# Patient Record
Sex: Male | Born: 1935 | Race: White | Hispanic: No | Marital: Married
Health system: Southern US, Community
[De-identification: ages and names within clinical notes are randomized; demographics above are authoritative.]

---

## 2003-08-11 ENCOUNTER — Other Ambulatory Visit: Payer: Self-pay

## 2005-03-15 ENCOUNTER — Ambulatory Visit: Payer: Self-pay | Admitting: Internal Medicine

## 2005-03-18 ENCOUNTER — Ambulatory Visit: Payer: Self-pay | Admitting: Internal Medicine

## 2005-04-04 ENCOUNTER — Ambulatory Visit: Payer: Self-pay | Admitting: Urology

## 2005-04-04 ENCOUNTER — Other Ambulatory Visit: Payer: Self-pay

## 2005-04-07 ENCOUNTER — Ambulatory Visit: Payer: Self-pay | Admitting: Urology

## 2005-07-12 ENCOUNTER — Emergency Department: Payer: Self-pay | Admitting: Unknown Physician Specialty

## 2005-07-12 ENCOUNTER — Other Ambulatory Visit: Payer: Self-pay

## 2005-09-23 ENCOUNTER — Emergency Department: Payer: Self-pay | Admitting: Internal Medicine

## 2005-09-23 ENCOUNTER — Other Ambulatory Visit: Payer: Self-pay

## 2006-10-02 ENCOUNTER — Other Ambulatory Visit: Payer: Self-pay

## 2006-10-02 ENCOUNTER — Emergency Department: Payer: Self-pay | Admitting: Emergency Medicine

## 2006-10-18 ENCOUNTER — Ambulatory Visit: Payer: Self-pay | Admitting: Urology

## 2006-10-24 ENCOUNTER — Other Ambulatory Visit: Payer: Self-pay

## 2006-10-24 ENCOUNTER — Ambulatory Visit: Payer: Self-pay | Admitting: Urology

## 2006-10-26 ENCOUNTER — Ambulatory Visit: Payer: Self-pay | Admitting: Urology

## 2006-12-16 ENCOUNTER — Other Ambulatory Visit: Payer: Self-pay

## 2006-12-16 ENCOUNTER — Emergency Department: Payer: Self-pay

## 2008-02-21 ENCOUNTER — Emergency Department: Payer: Self-pay | Admitting: Emergency Medicine

## 2010-06-21 ENCOUNTER — Emergency Department: Payer: Self-pay | Admitting: Emergency Medicine

## 2011-01-31 ENCOUNTER — Emergency Department: Payer: Self-pay | Admitting: Emergency Medicine

## 2011-06-11 ENCOUNTER — Inpatient Hospital Stay: Payer: Self-pay | Admitting: Internal Medicine

## 2011-11-24 ENCOUNTER — Emergency Department: Payer: Self-pay | Admitting: Emergency Medicine

## 2011-12-27 ENCOUNTER — Inpatient Hospital Stay: Payer: Self-pay | Admitting: Internal Medicine

## 2011-12-27 LAB — CBC
HGB: 12.2 g/dL — ABNORMAL LOW (ref 13.0–18.0)
MCHC: 31.7 g/dL — ABNORMAL LOW (ref 32.0–36.0)
Platelet: 220 10*3/uL (ref 150–440)
RBC: 4.26 10*6/uL — ABNORMAL LOW (ref 4.40–5.90)
WBC: 14.7 10*3/uL — ABNORMAL HIGH (ref 3.8–10.6)

## 2011-12-27 LAB — TROPONIN I: Troponin-I: 0.04 ng/mL

## 2011-12-27 LAB — URINALYSIS, COMPLETE
Bilirubin,UR: NEGATIVE
Ketone: NEGATIVE
Ph: 5 (ref 4.5–8.0)
Specific Gravity: 1.014 (ref 1.003–1.030)
Squamous Epithelial: NONE SEEN
WBC UR: 1137 /HPF (ref 0–5)

## 2011-12-27 LAB — COMPREHENSIVE METABOLIC PANEL
Albumin: 2.5 g/dL — ABNORMAL LOW (ref 3.4–5.0)
Alkaline Phosphatase: 76 U/L (ref 50–136)
Anion Gap: 10 (ref 7–16)
BUN: 52 mg/dL — ABNORMAL HIGH (ref 7–18)
Bilirubin,Total: 0.5 mg/dL (ref 0.2–1.0)
Calcium, Total: 9.6 mg/dL (ref 8.5–10.1)
Co2: 22 mmol/L (ref 21–32)
Creatinine: 2.16 mg/dL — ABNORMAL HIGH (ref 0.60–1.30)
EGFR (Non-African Amer.): 27 — ABNORMAL LOW
Glucose: 141 mg/dL — ABNORMAL HIGH (ref 65–99)
Osmolality: 301 (ref 275–301)
Sodium: 143 mmol/L (ref 136–145)
Total Protein: 8.3 g/dL — ABNORMAL HIGH (ref 6.4–8.2)

## 2011-12-27 LAB — CK TOTAL AND CKMB (NOT AT ARMC): CK, Total: 197 U/L (ref 35–232)

## 2011-12-28 LAB — CBC WITH DIFFERENTIAL/PLATELET
Basophil #: 0 10*3/uL (ref 0.0–0.1)
Basophil %: 0.1 %
Eosinophil #: 0 10*3/uL (ref 0.0–0.7)
HCT: 36.1 % — ABNORMAL LOW (ref 40.0–52.0)
Lymphocyte #: 0.3 10*3/uL — ABNORMAL LOW (ref 1.0–3.6)
MCH: 29.3 pg (ref 26.0–34.0)
MCHC: 32.5 g/dL (ref 32.0–36.0)
MCV: 90 fL (ref 80–100)
Monocyte #: 1 x10 3/mm (ref 0.2–1.0)
Neutrophil #: 11.8 10*3/uL — ABNORMAL HIGH (ref 1.4–6.5)
Platelet: 214 10*3/uL (ref 150–440)
RBC: 4.01 10*6/uL — ABNORMAL LOW (ref 4.40–5.90)
RDW: 14.7 % — ABNORMAL HIGH (ref 11.5–14.5)
WBC: 13.1 10*3/uL — ABNORMAL HIGH (ref 3.8–10.6)

## 2011-12-28 LAB — BASIC METABOLIC PANEL
EGFR (African American): 41 — ABNORMAL LOW
EGFR (Non-African Amer.): 35 — ABNORMAL LOW
Glucose: 128 mg/dL — ABNORMAL HIGH (ref 65–99)
Osmolality: 311 (ref 275–301)
Sodium: 149 mmol/L — ABNORMAL HIGH (ref 136–145)

## 2011-12-28 LAB — HEPATIC FUNCTION PANEL A (ARMC)
Albumin: 2.1 g/dL — ABNORMAL LOW (ref 3.4–5.0)
Alkaline Phosphatase: 79 U/L (ref 50–136)
Bilirubin, Direct: 0.1 mg/dL (ref 0.00–0.20)
SGOT(AST): 52 U/L — ABNORMAL HIGH (ref 15–37)

## 2011-12-28 LAB — HEMOGLOBIN A1C: Hemoglobin A1C: 5.4 % (ref 4.2–6.3)

## 2011-12-29 LAB — BASIC METABOLIC PANEL
Anion Gap: 8 (ref 7–16)
BUN: 44 mg/dL — ABNORMAL HIGH (ref 7–18)
Calcium, Total: 9.1 mg/dL (ref 8.5–10.1)
Creatinine: 1.2 mg/dL (ref 0.60–1.30)
EGFR (African American): 46 — ABNORMAL LOW
EGFR (Non-African Amer.): 39 — ABNORMAL LOW
EGFR (Non-African Amer.): 54 — ABNORMAL LOW
Glucose: 130 mg/dL — ABNORMAL HIGH (ref 65–99)
Glucose: 158 mg/dL — ABNORMAL HIGH (ref 65–99)
Osmolality: 305 (ref 275–301)
Potassium: 3.5 mmol/L (ref 3.5–5.1)
Sodium: 142 mmol/L (ref 136–145)

## 2011-12-29 LAB — URINE CULTURE

## 2011-12-30 LAB — CULTURE, BLOOD (SINGLE)

## 2012-01-01 LAB — CBC WITH DIFFERENTIAL/PLATELET
Basophil #: 0 10*3/uL (ref 0.0–0.1)
Eosinophil %: 2 %
HCT: 37.9 % — ABNORMAL LOW (ref 40.0–52.0)
Lymphocyte %: 11 %
MCHC: 31.5 g/dL — ABNORMAL LOW (ref 32.0–36.0)
MCV: 90 fL (ref 80–100)
Monocyte #: 1 x10 3/mm (ref 0.2–1.0)
Monocyte %: 7.8 %
Neutrophil %: 78.8 %
Platelet: 343 10*3/uL (ref 150–440)
RBC: 4.22 10*6/uL — ABNORMAL LOW (ref 4.40–5.90)
RDW: 14.4 % (ref 11.5–14.5)
WBC: 12.1 10*3/uL — ABNORMAL HIGH (ref 3.8–10.6)

## 2012-01-01 LAB — BASIC METABOLIC PANEL
Anion Gap: 11 (ref 7–16)
BUN: 23 mg/dL — ABNORMAL HIGH (ref 7–18)
Calcium, Total: 8.9 mg/dL (ref 8.5–10.1)
EGFR (Non-African Amer.): 57 — ABNORMAL LOW
Glucose: 125 mg/dL — ABNORMAL HIGH (ref 65–99)
Osmolality: 285 (ref 275–301)

## 2012-01-01 LAB — MAGNESIUM: Magnesium: 1.9 mg/dL

## 2012-01-02 LAB — BASIC METABOLIC PANEL
Calcium, Total: 8.6 mg/dL (ref 8.5–10.1)
Co2: 23 mmol/L (ref 21–32)
Creatinine: 1.15 mg/dL (ref 0.60–1.30)
EGFR (African American): >60
EGFR (Non-African Amer.): 57 — ABNORMAL LOW
Glucose: 128 mg/dL — ABNORMAL HIGH (ref 65–99)
Osmolality: 284 (ref 275–301)

## 2012-01-02 LAB — CULTURE, BLOOD (SINGLE)

## 2012-04-28 ENCOUNTER — Emergency Department: Payer: Self-pay | Admitting: Emergency Medicine

## 2012-04-28 LAB — BASIC METABOLIC PANEL WITH GFR
Anion Gap: 9
BUN: 24 mg/dL — ABNORMAL HIGH
Calcium, Total: 9.2 mg/dL
Chloride: 114 mmol/L — ABNORMAL HIGH
Co2: 24 mmol/L
Creatinine: 1.38 mg/dL — ABNORMAL HIGH
EGFR (African American): 53 — ABNORMAL LOW
EGFR (Non-African Amer.): 46 — ABNORMAL LOW
Glucose: 126 mg/dL — ABNORMAL HIGH
Osmolality: 298
Potassium: 3.8 mmol/L
Sodium: 147 mmol/L — ABNORMAL HIGH

## 2012-04-28 LAB — CBC
HGB: 13 g/dL (ref 13.0–18.0)
MCV: 89 fL (ref 80–100)
Platelet: 250 10*3/uL (ref 150–440)
RBC: 4.38 10*6/uL — ABNORMAL LOW (ref 4.40–5.90)
WBC: 11.4 10*3/uL — ABNORMAL HIGH (ref 3.8–10.6)

## 2012-04-28 LAB — URINALYSIS, COMPLETE
Bilirubin,UR: NEGATIVE
Glucose,UR: NEGATIVE mg/dL (ref 0–75)
Ketone: NEGATIVE
Nitrite: NEGATIVE
Ph: 9 (ref 4.5–8.0)
Specific Gravity: 1.016 (ref 1.003–1.030)
Squamous Epithelial: NONE SEEN

## 2012-10-25 DEATH — deceased

## 2014-05-11 IMAGING — CR RIGHT ANKLE - 2 VIEW
1 series · 2 of 2 positions shown · non-contrast
Comparison: none

REASON FOR EXAM: fall, ankle swelling, pain
COMMENTS:

[Series 1: x ankle ap right · 0.14mm/px · 2 of 2 slices shown]
[im 1/2]
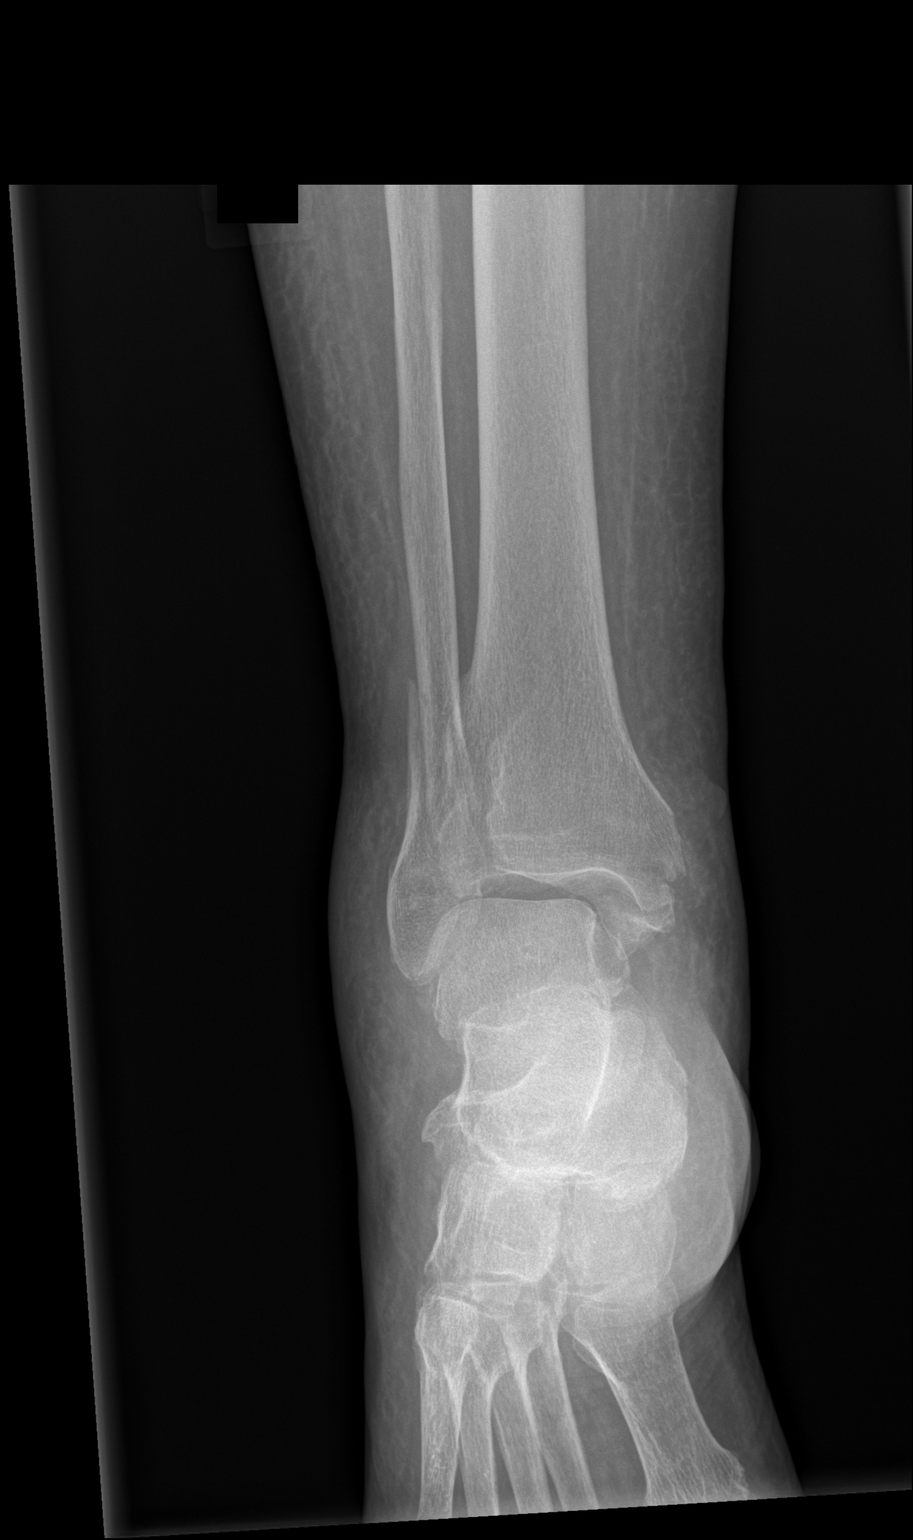
[im 2/2]
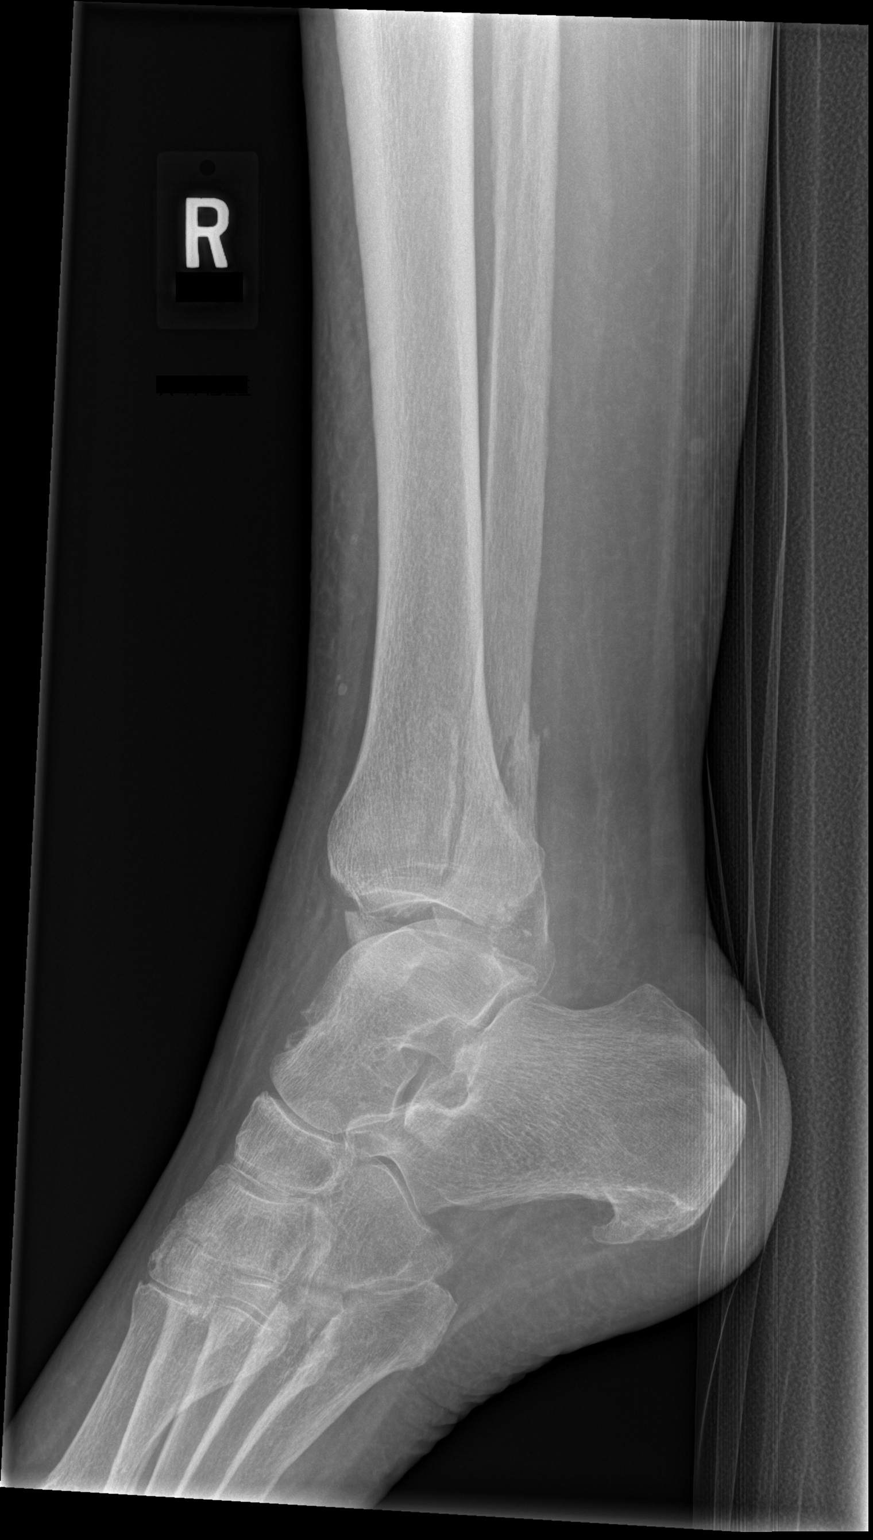

[2 of 2 positions shown; findings below may reference images not displayed]

PROCEDURE:     DXR - DXR ANKLE RIGHT AP AND LATERAL  - November 24, 2011  [DATE]

RESULT:     There is a bimalleolar fracture at the right ankle. The fracture
is observed through the medial malleolus. The tibia shows medial subluxation
by 3 to 4 mm with respect to the talus and the medial malleolar fragment.
There is an oblique fracture of the lateral malleolus. Bony fracture
components are minimally displaced. No definite posterior malleolar fracture
is seen on this exam. In the lateral view, there is incidentally noted a
plantar calcaneal spur.
IMPRESSION: 1.  There is a bimalleolar fracture of the right ankle.
2.  The ankle mortise is opened medially.
3.  Incidental note is made of a plantar calcaneal spur.

[REDACTED]

## 2014-06-19 IMAGING — CR RIGHT HAND - COMPLETE 3+ VIEW
1 series · 3 of 3 positions shown · non-contrast
Comparison: none

REASON FOR EXAM: gouty attack, pain and erythema of MCP joints
COMMENTS:

[Series 1: lat · 0.17mm/px · 3 of 3 slices shown]
[im 1/3]
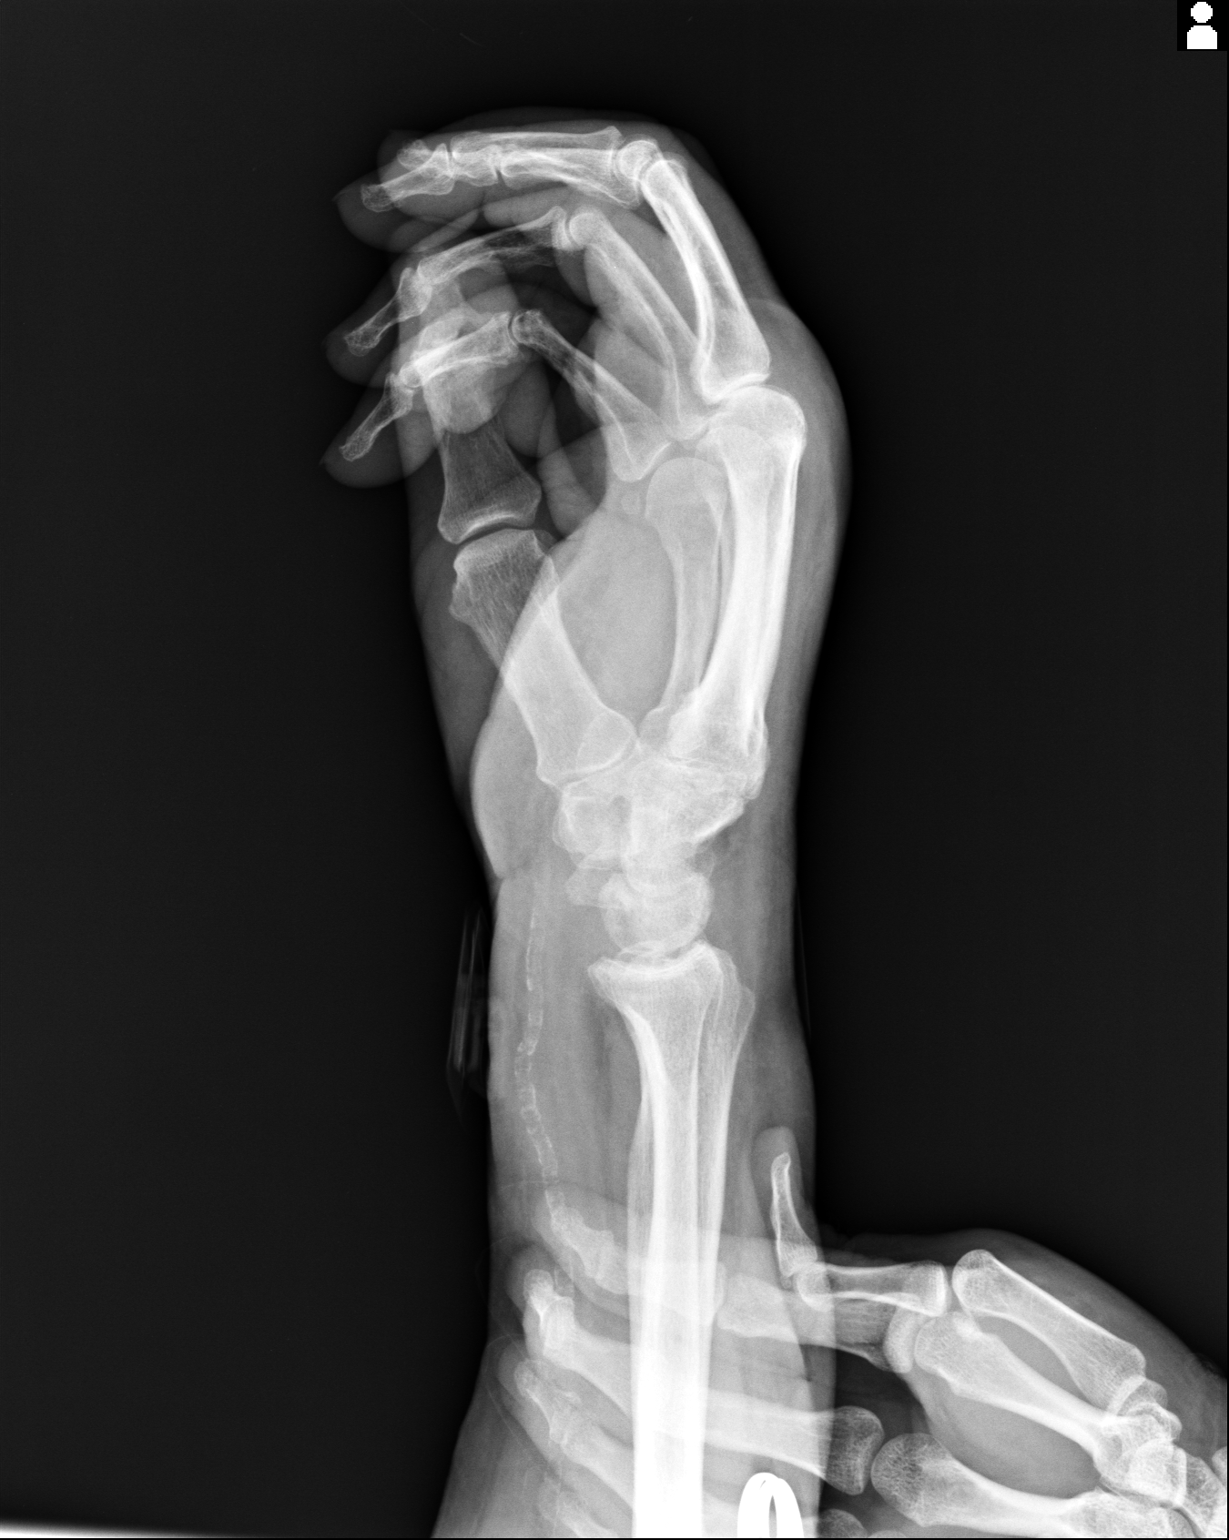
[im 2/3]
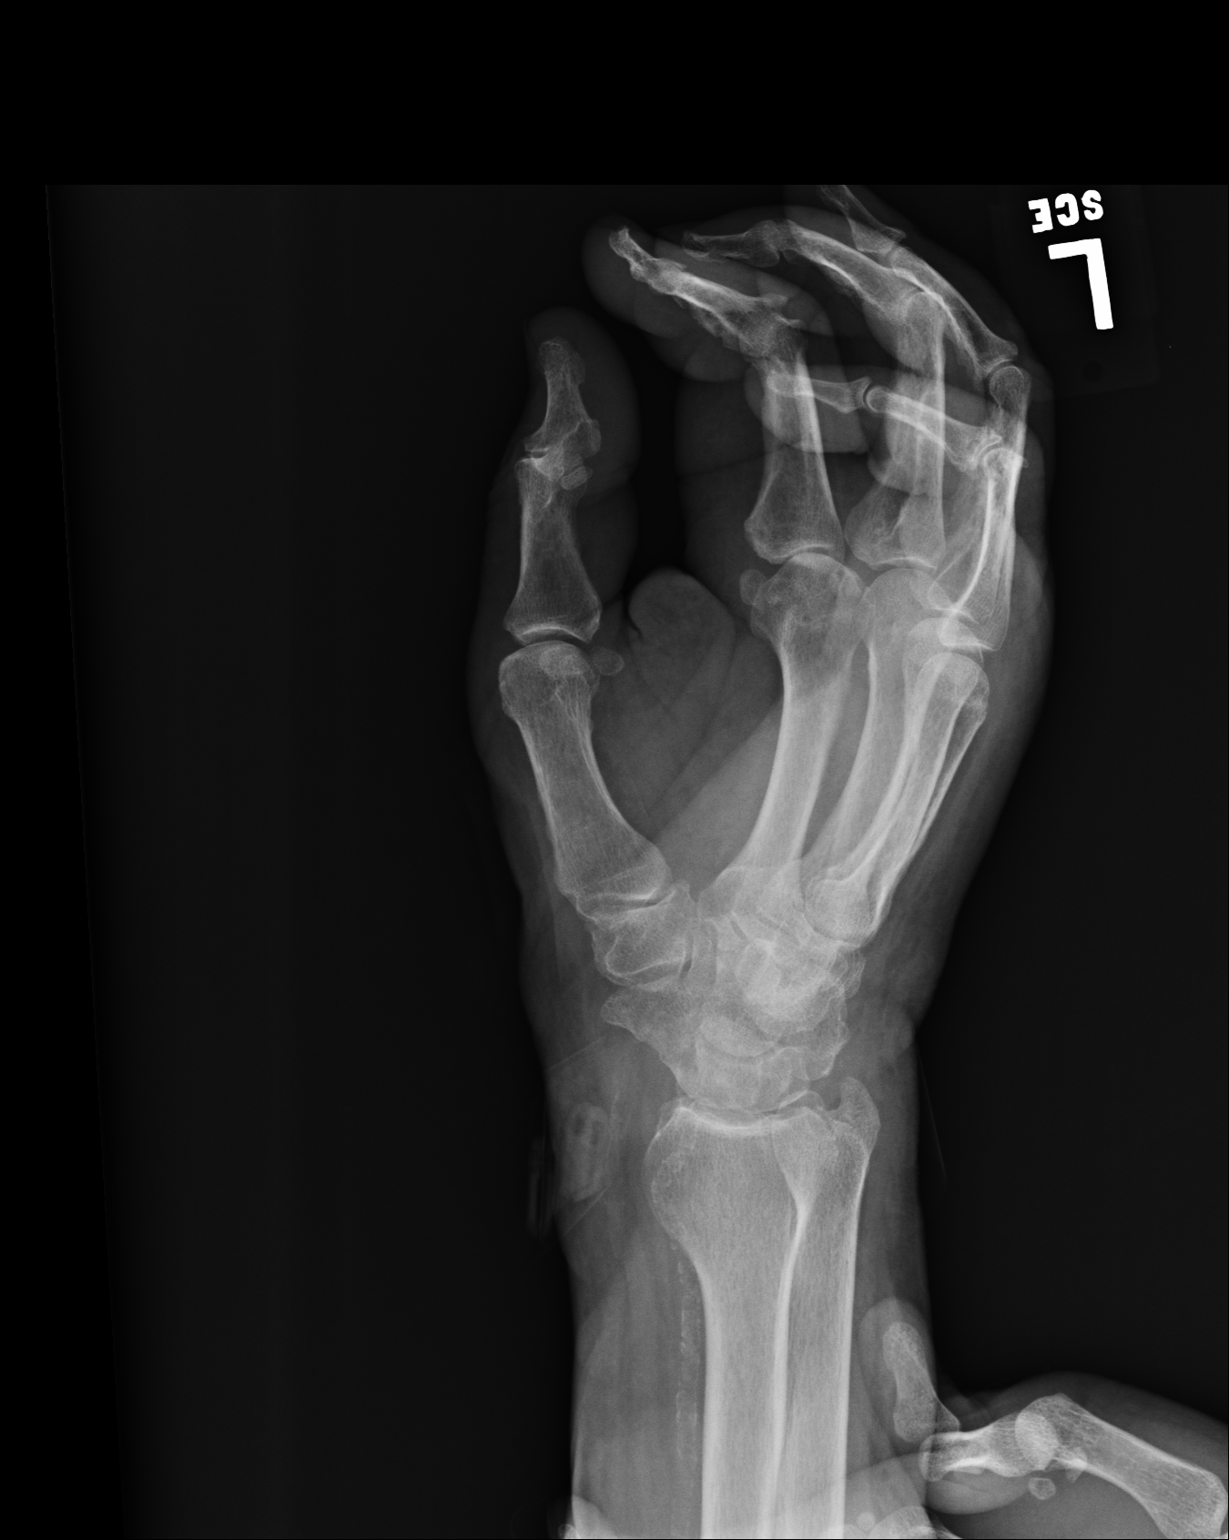
[im 3/3]
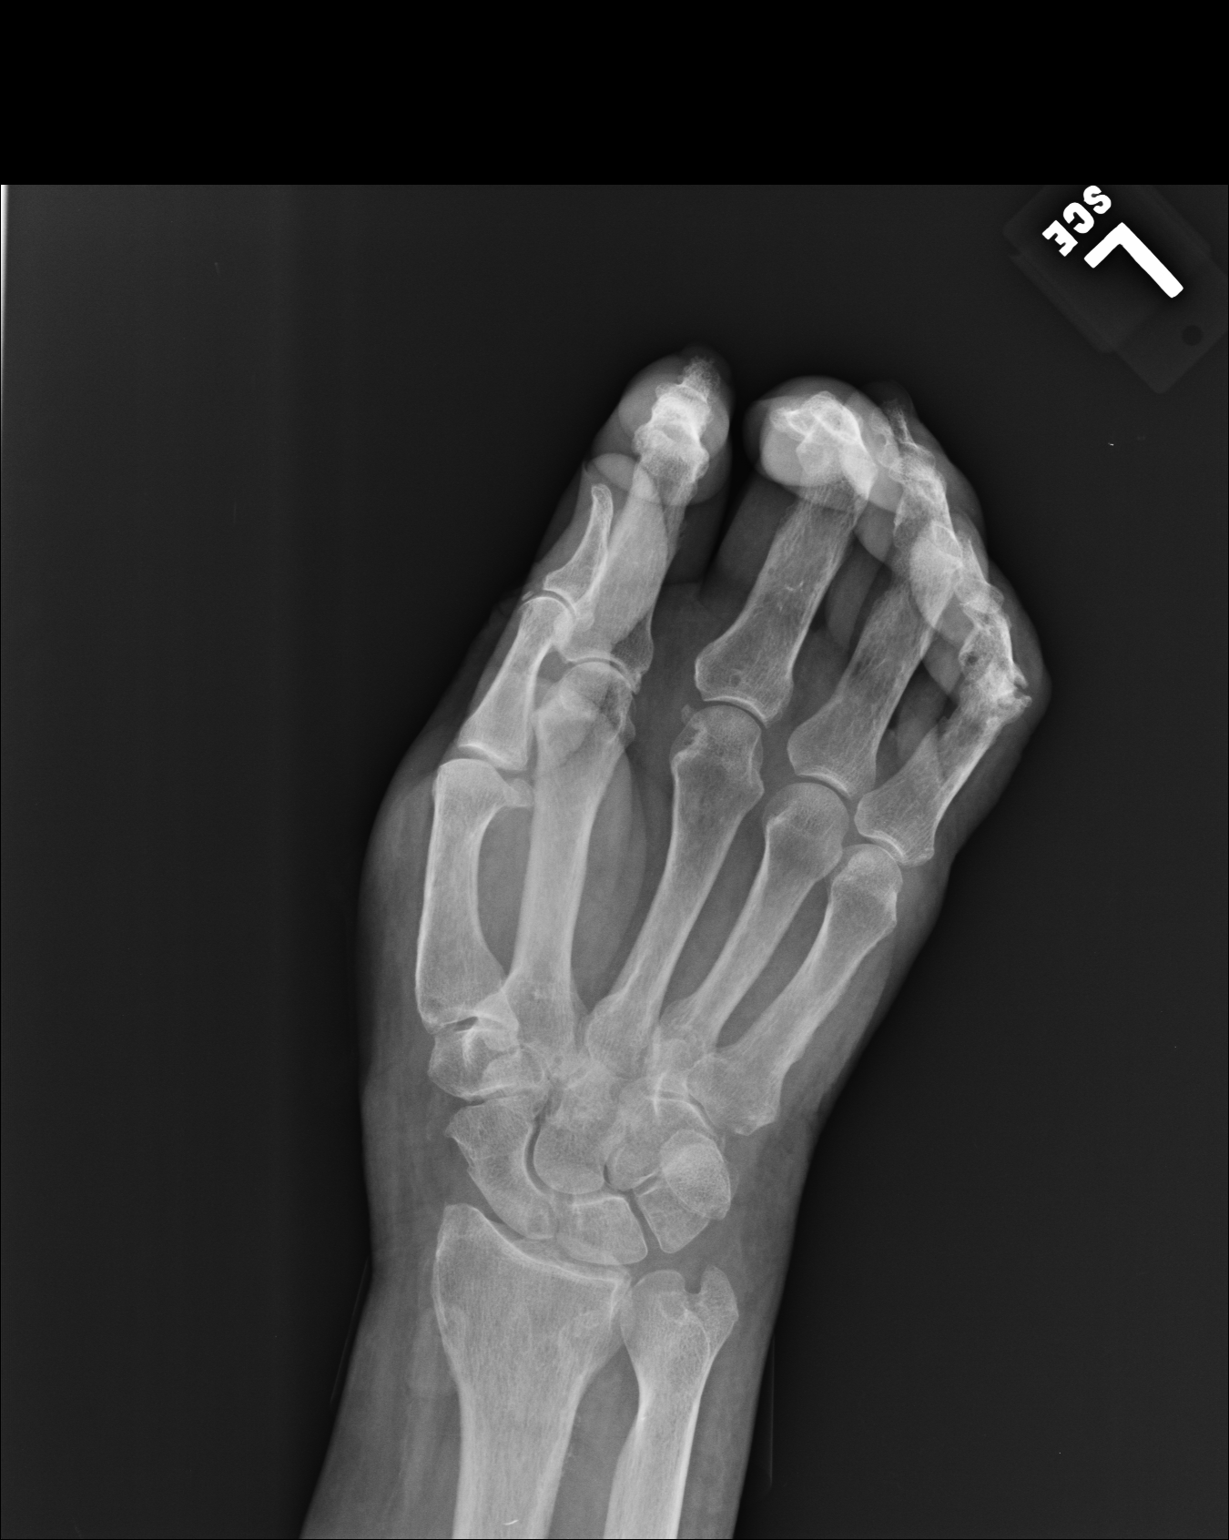

[3 of 3 positions shown; findings below may reference images not displayed]

PROCEDURE:     DXR - DXR HAND RT COMPLETE W/OBLIQUES  - January 02, 2012  [DATE]

RESULT:     Three views of the right hand are submitted. The patient was
unwilling or unable to extend the fingers.

There are degenerative changes of the interphalangeal joints especially of
the PIP joint of the fifth digit. There is also degenerative change of the
PIP joint of the second digit. No definite acute fracture is demonstrated.
There are vascular calcifications present.
IMPRESSION: The study is limited due to the patient's inability to
extend the fingers. There are findings that may reflect osteoarthritis or
gouty arthritis of the PIP joints of the second and fifth fingers. More
proximally no more than mild degenerative type change is evident. There is
mild soft tissue swelling.

## 2014-10-14 IMAGING — CR DG ABDOMEN 1V
1 series · 2 of 2 positions shown · non-contrast
Comparison: none

REASON FOR EXAM: hematuria, eval for kidney stone
COMMENTS:

[Series 1: t abdomen supine · 0.14mm/px · 2 of 2 slices shown]
[im 1/2]
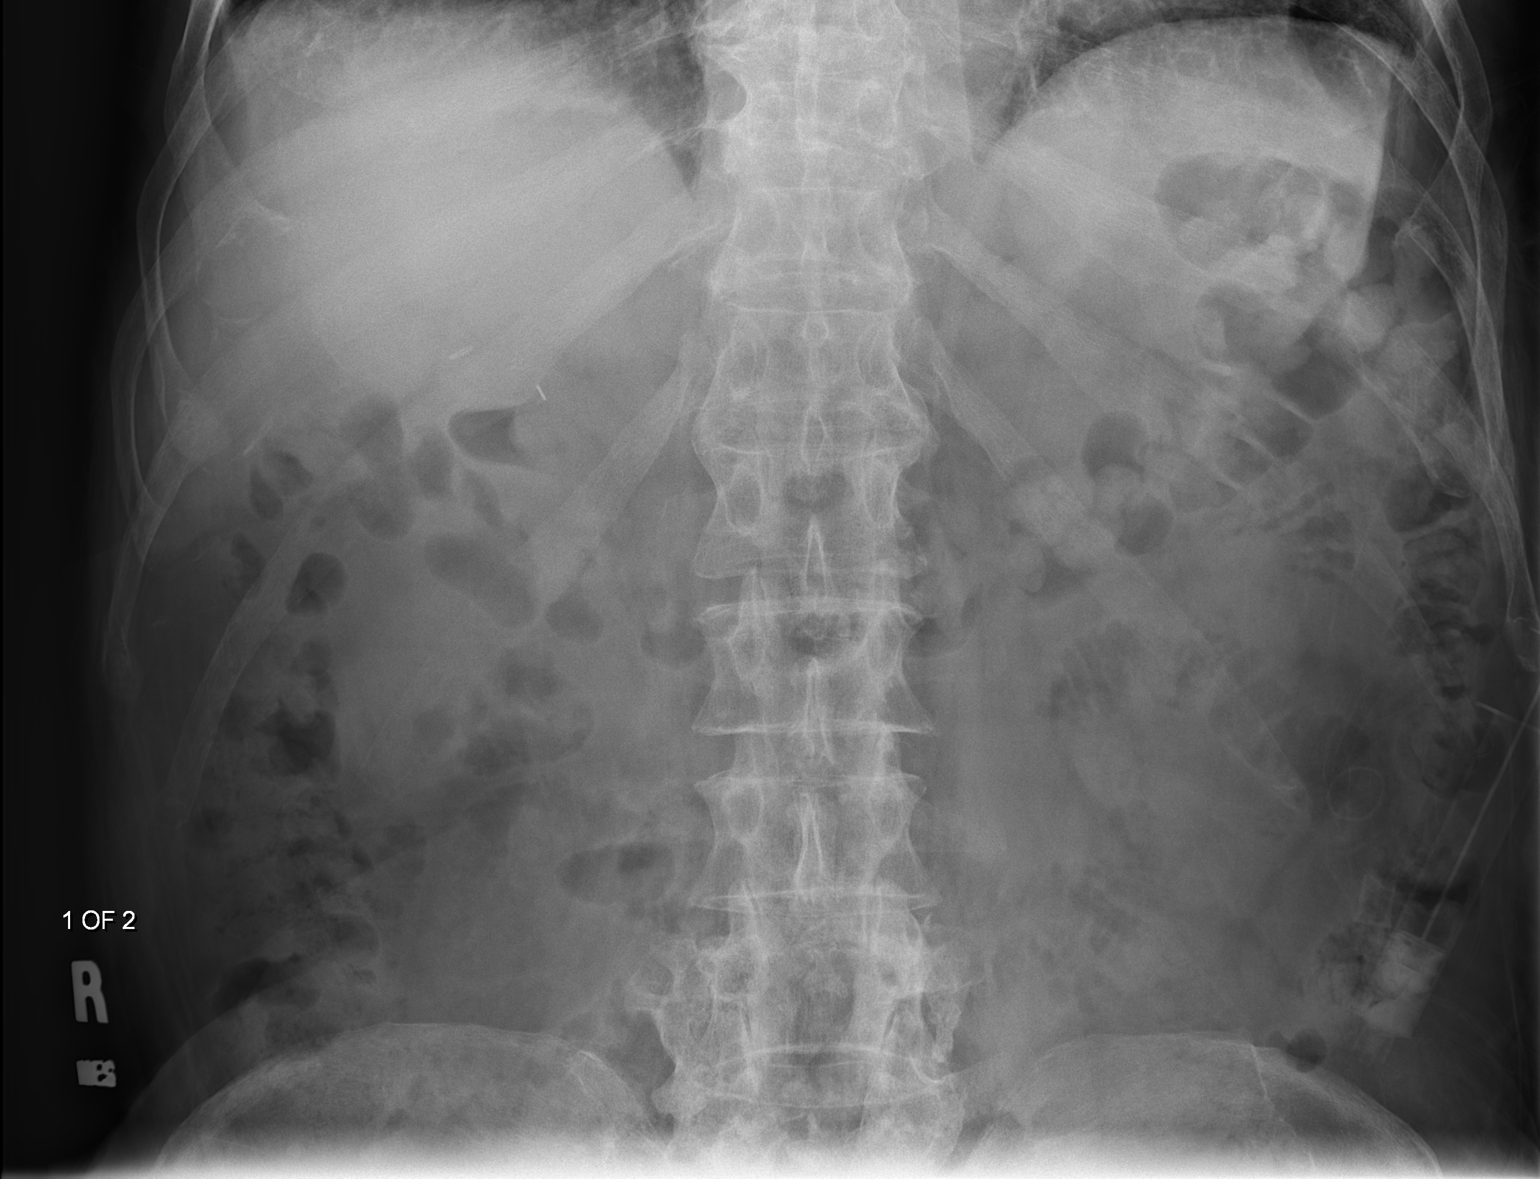
[im 2/2]
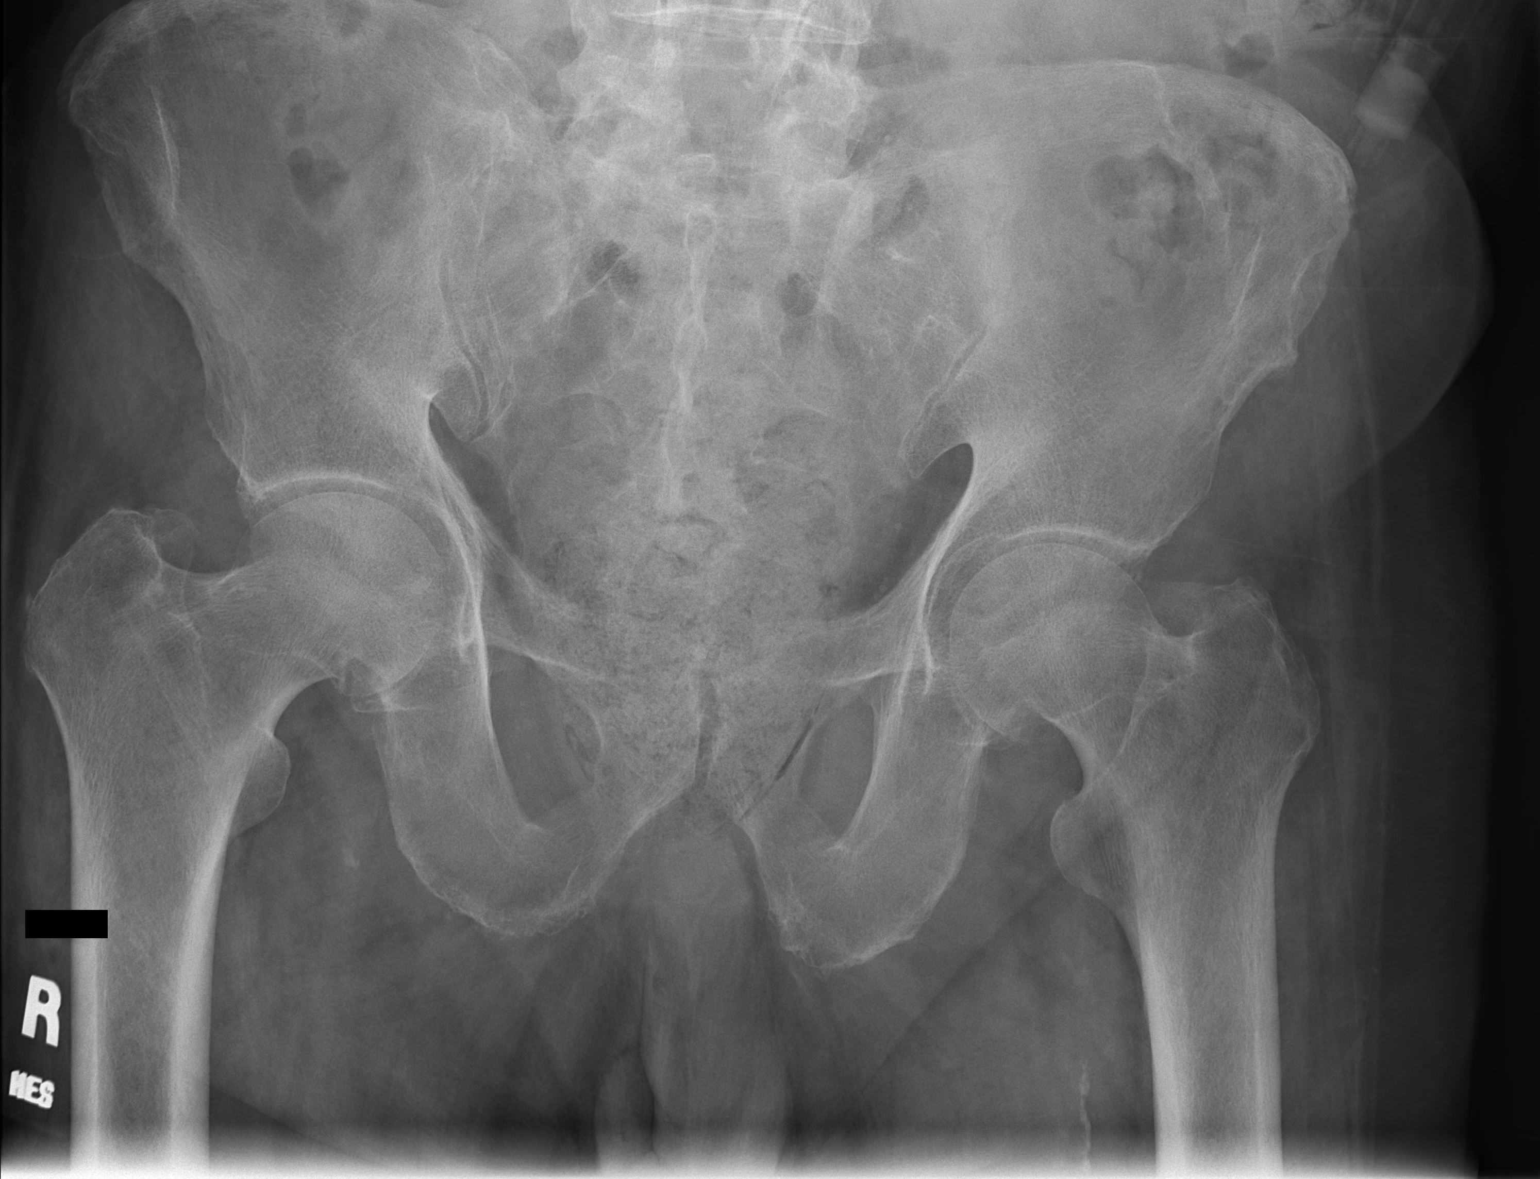

[2 of 2 positions shown; findings below may reference images not displayed]

PROCEDURE:     DXR - DXR KIDNEY URETER BLADDER  - April 28, 2012  [DATE]

RESULT:     Surgical clips right upper quadrant. Gas pattern nonspecific.
Calcification pelvis consistent with phleboliths. Degenerative change lumbar
spine. Peripheral vascular disease. Degenerative changes both hips. Stool in
colon.
IMPRESSION: No acute abnormality.

## 2014-10-19 NOTE — Discharge Summary (Signed)
PATIENT NAME:  Gregory Carlson, Gregory Carlson MR#:  [id number] DATE OF BIRTH:  1923/12/15  DATE OF ADMISSION:  12/27/2011 DATE OF DISCHARGE:  01/03/2012  ADMITTING PHYSICIAN: Shaune Pollack, MD   DISCHARGING PHYSICIAN: Enid Baas, MD    PRIMARY MD: Dr. Vassie Loll IN THE HOSPITAL: Speech consultation    DISCHARGE DIAGNOSES:  1. Metabolic encephalopathy.  2. Escherichia urinary tract infection and sepsis with positive blood cultures.  3. Acute gout affecting the right hand joints and also elbow.  4. Recent right ankle fracture, currently in a cast and nonweightbearing.  5. Left leg contraction from not using after his right ankle fracture.  6. Acute renal failure.  7. Chronic kidney disease, stage III.  8. Dementia.  9. Hypertension.  10. Hypernatremia, resolved.   DISCHARGE HOME MEDICATIONS: 1. Peridex 0.12% mucous membrane liquid 15 mL orally for 30 seconds after brushing 3 times a day.  2. Cranberry oral capsule 400 mg orally b.i.d.  3. Aspirin 81 mg p.o. b.i.d.  4. Polyethylene glycol powder 17 grams twice a day p.r.n. for constipation.  5. 5% Lidoderm topical film apply to right knee 12 hours on and 12 hours off.  6. Loratadine 10 mg p.o. daily.  7. Omeprazole 20 mg p.o. daily.  8. PreserVision 2 capsules once a day.  9. Vitamin B12 500 mcg p.o. daily.  10. Multivitamin 1 tablet p.o. daily.  11. Vitamin D3 2000 international units p.o. daily.  12. Divalproex sodium 125 mg p.o. b.i.d.  13. Zoloft 100 mg p.o. daily.  14. Donepezil 10 mg p.o. daily.  15. Atorvastatin 20 mg p.o. daily.  16. Lorazepam 0.25 mg p.o. once a day as needed for anxiety and nervousness.  17. Lorazepam 0.5 mg p.o. b.i.d.  18. Gabapentin 200 mg p.o. at bedtime.  19. Prednisone taper start at 50 mg p.o. daily and taper off by 10 mg every other day.  20. Tylenol 650 mg p.o. q.4 hours p.r.n. for pain.  21. Roxanol 20 mg/mL solution 0.25 mL q.4 hours p.r.n. for pain.  22. Flomax 0.4 mg p.o. daily.   23. Norvasc 10 mg p.o. daily.  24. Olanzapine 2.5 mg oral disintegrating tablet twice a day p.r.n. for agitation.  25. Levaquin 500 mg p.o. daily until 01/11/2012.   DISCHARGE DIET: Low sodium diet. Consistency is puree diet with nectar thick liquids and aspiration precautions. Please also send Magic cup at lunch and dinner.   DISCHARGE FOLLOW-UP RECOMMENDATIONS:  1. PCP follow-up in 1 to 2 weeks.  2. Physical therapy for lower extremities.  3. Follow-up with Dr. Deeann Saint for right ankle fracture post surgery as prior scheduled.   LABS AND IMAGING STUDIES: Sodium 140, potassium 3.6, chloride 109, bicarb 23, BUN 20, creatinine 1.15, glucose 128, calcium 8.6. Magnesium 1.7. WBC 12.1, hemoglobin 12.0, hematocrit 37.9, platelet count 343.  Ultrasound of kidneys bilaterally showing normal renal ultrasound, simple cyst of lower pole of left kidney.   Initial blood culture from 12/27/2011 positive for Escherichia coli, both sets, and repeat blood cultures from July 2nd later in the day were negative. Urine cultures also grew greater than 100,000 colonies of Escherichia coli.   BRIEF HOSPITAL COURSE: Gregory Carlson is an 81 year old Caucasian male with past medical history of Alzheimer's dementia, paroxysmal atrial fibrillation, and hypertension who was sent in from [facility] for confusion, lethargy, and mental status changes more changed than baseline.  1. Metabolic encephalopathy secondary to Escherichia coli sepsis and urinary tract infection, improved and is back  to baseline after treatment with antibiotics.  2. Escherichia coli sepsis and urinary tract infection, likely source urine. His urine cultures were positive and both blood cultures were positive for Escherichia coli and was started on Rocephin until the sensitivities were back and sensitive to fluoroquinolones. He is being changed to oral Levaquin since it's bacteremia and total duration is two weeks and will finish up  by 01/11/2012.  3. Acute gout. He does have history of gout and has occasional gouty attacks. He did have involvement of metacarpophalangeal joints of his right hand with some swelling and redness. X-ray was negative other than for just mild effusion. He was started on steroid taper with improvement. Continue to use prednisone until finishes off the course at this time.   4. Hypertension. He is started on Norvasc at this time for hypertension.  5. Recent right ankle fracture. He was seen by Dr. Deeann SaintHoward Miller, follows up with him. Surgery happened three weeks ago and still in a cast, is nonweightbearing at this time. Follow-up with him as recommended prior.  6. Acute renal failure on chronic kidney disease, stage III. He came in with acute renal failure probably from sepsis ATN and that improved and is back to baseline at the time of discharge.  7. He also was hypernatremic and was placed on D5 with improvement in his sodium prior to discharge.   His course has been otherwise uneventful in the hospital. He did have periods of agitation for which he required p.r.n. olanzapine and also Ativan   CODE STATUS: DO NOT RESUSCITATE. He has an out-of-facility DO NOT RESUSCITATE order already signed and placed in the chart and also verified with daughter prior to discharge.   DISCHARGE CONDITION: Stable.   PROGNOSIS: Guarded.   DISCHARGE DISPOSITION: Assisted living.   TIME SPENT ON DISCHARGE: 45 minutes.       Also, the patient is followed by Hospice while at assisted living.   ____________________________ Enid Baasadhika Shenia Alan, MD rk:drc D: 01/03/2012 16:39:01 ET T: 01/04/2012 10:58:49 ET JOB#: [id number]  cc: Enid Baasadhika Alexsis Branscom, MD, <Dictator> Drue DunHenry F. Redmond Schoolripp, MD Enid BaasADHIKA Hannahmarie Asberry MD ELECTRONICALLY SIGNED 01/05/2012 14:23

## 2014-10-19 NOTE — H&P (Signed)
PATIENT NAME:  Gregory Carlson, CLONINGER MR#:  [id number] DATE OF BIRTH:  1924/05/07  DATE OF ADMISSION:  12/27/2011  PRIMARY CARE PHYSICIAN: Dr. Redmond School   REFERRING PHYSICIAN: Dr. Enedina Finner   CHIEF COMPLAINT: Mental status changes.   HISTORY OF PRESENT ILLNESS: The patient is an 81 year old Caucasian male with a history of Alzheimer's dementia, atrial fibrillation, hypertension, renal stone and neuropathy who was sent from nursing home due to confusion, lethargy, and mental status change. The patient is disoriented, noncommunicative, could not provide any information. According to his son, the patient was sent from nursing home due to the above-mentioned issue. The patient had a fall and a right ankle fracture five days ago and since that time the patient has been bedbound. The patient was found confused and lethargic in the nursing home today and sent to the ED for further evaluation. In the ED urinalysis showed urinary tract infection. He was treated with Levaquin and admitted for urinary tract infection by Dr. Enedina Finner. According to the patient's family member, the patient is DO NOT RESUSCITATE status and in Hospice care in the nursing home.   PAST MEDICAL HISTORY: As mentioned above:  1. Alzheimer's dementia. 2. Atrial fibrillation.   3. Hypertension. 4. Renal stone and neuropathy.   SOCIAL HISTORY: Nursing home resident. No smoking, alcohol, or illicit drugs.   FAMILY HISTORY: Unknown.  REVIEW OF SYSTEMS: Unable to obtain due to the patient's status.    ALLERGIES: Codeine and sulfa drugs.   MEDICATIONS: Please refer to the medication reconciliation list.   PHYSICAL EXAMINATION:   VITALS: Temperature 99.8, blood pressure 161/72, pulse 63, respirations 18, oxygen saturation 97% on room air.   GENERAL: The patient is lethargic, disoriented, noncommunicative but in no acute distress.   HEENT: Pupils are round and reactive to light, otherwise unable to examine due to the patient's mental status. Very  dry oral mucosa.   NECK: Supple. No JVD or carotid bruit. No lymphadenopathy. No thyromegaly.   CARDIOVASCULAR: S1, S2 regular rate but tachycardia.   PULMONARY: Bilateral air entry. No wheezing or rales.   ABDOMEN: Soft. No distention. Bowel sounds present. No obvious organomegaly but obese.   EXTREMITIES: No edema, clubbing, or cyanosis. Right leg in cast. Left ankle in dressing.   SKIN: No rash or jaundice.   NEUROLOGY: The patient is disoriented, lethargic, and unable to examine at this time   LABORATORY DATA: Glucose 141, BUN 52, creatinine 2.16. Previous renal function was normal. Sodium 143, potassium 4.2, chloride 111, bicarb 22, WBC 14.7, hemoglobin 12.2, platelet 220. Troponin 0.04. Urinalysis showed WBCs 1137, RBCs 13. ABG showed pH 7.47, pCO2 30, pO2 56.   EKG shows sinus tachycardia with frequent PVCs, left axis deviation.   IMPRESSION:  1. Altered mental status possibly due to urinary tract infection/sepsis.  2. Urinary tract infection.  3. Sepsis.  4. Acute renal failure.  5. Hypoxia.  6. Hypertension, uncontrolled.  7. Dementia.   CODE STATUS: DO NOT RESUSCITATE.   PLAN OF TREATMENT:  1. The patient will be admitted to medical floor. 2. Will continue oxygen through nasal cannula and give Rocephin IVPB and follow-up CBC, blood culture, urine culture.  3. For acute renal failure, we will give normal saline IV. Follow-up BMP.  4. For hypertension, will give hydralazine IV p.r.n. and Norvasc.  Discussed the patient's critical situation with the patient's son and the family member. Discussed the patient's CODE STATUS and the plan of treatment. The patient's family members do not want pressor medication if  the patient developed hypotension. The patient is DO NOT RESUSCITATE status and Hospice care.   TIME SPENT: About 55 minutes.   ____________________________ Shaune PollackQing Loreen Bankson, MD qc:drc D: 12/27/2011 20:27:57 ET T: 12/28/2011 05:44:15 ET JOB#: [id number]  cc: Shaune PollackQing  Ardyth Kelso, MD, <Dictator> Dr. Merrilee Seashoreripp  Kambria Grima MD ELECTRONICALLY SIGNED 12/28/2011 14:29
